# Patient Record
Sex: Male | Born: 1960 | Race: White | Hispanic: No | Marital: Single | Smoking: Never smoker
Health system: Southern US, Community
[De-identification: ages and names within clinical notes are randomized; demographics above are authoritative.]

## PROBLEM LIST (undated history)

## (undated) DIAGNOSIS — I2699 Other pulmonary embolism without acute cor pulmonale: Secondary | ICD-10-CM

## (undated) DIAGNOSIS — I82439 Acute embolism and thrombosis of unspecified popliteal vein: Secondary | ICD-10-CM

## (undated) HISTORY — DX: Other pulmonary embolism without acute cor pulmonale: I26.99

## (undated) HISTORY — DX: Acute embolism and thrombosis of unspecified popliteal vein: I82.439

---

## 2006-02-07 ENCOUNTER — Encounter: Admission: RE | Admit: 2006-02-07 | Discharge: 2006-02-07 | Payer: Self-pay | Admitting: Orthopedic Surgery

## 2008-04-08 ENCOUNTER — Encounter (INDEPENDENT_AMBULATORY_CARE_PROVIDER_SITE_OTHER): Payer: Self-pay | Admitting: Surgery

## 2008-04-08 ENCOUNTER — Ambulatory Visit (HOSPITAL_BASED_OUTPATIENT_CLINIC_OR_DEPARTMENT_OTHER): Admission: RE | Admit: 2008-04-08 | Discharge: 2008-04-08 | Payer: Self-pay | Admitting: Surgery

## 2009-10-17 ENCOUNTER — Emergency Department (HOSPITAL_BASED_OUTPATIENT_CLINIC_OR_DEPARTMENT_OTHER): Admission: EM | Admit: 2009-10-17 | Discharge: 2009-10-17 | Payer: Self-pay | Admitting: Emergency Medicine

## 2010-03-13 ENCOUNTER — Encounter: Payer: Self-pay | Admitting: Orthopedic Surgery

## 2010-06-07 LAB — DIFFERENTIAL
Basophils Relative: 1 % (ref 0–1)
Eosinophils Absolute: 0.2 10*3/uL (ref 0.0–0.7)
Lymphocytes Relative: 17 % (ref 12–46)
Monocytes Relative: 6 % (ref 3–12)

## 2010-06-07 LAB — CBC
HCT: 41.3 % (ref 39.0–52.0)
Hemoglobin: 14.2 g/dL (ref 13.0–17.0)
Platelets: 122 10*3/uL — ABNORMAL LOW (ref 150–400)
RBC: 4.72 MIL/uL (ref 4.22–5.81)
RDW: 13.6 % (ref 11.5–15.5)
WBC: 5.8 10*3/uL (ref 4.0–10.5)

## 2010-06-07 LAB — BASIC METABOLIC PANEL
Calcium: 8.8 mg/dL (ref 8.4–10.5)
Creatinine, Ser: 1.04 mg/dL (ref 0.4–1.5)
GFR calc non Af Amer: >60 mL/min (ref >60)
Glucose, Bld: 89 mg/dL (ref 70–99)
Potassium: 4.4 mEq/L (ref 3.5–5.1)
Sodium: 139 mEq/L (ref 135–145)

## 2010-06-07 LAB — POCT HEMOGLOBIN-HEMACUE: Hemoglobin: 14.7 g/dL (ref 13.0–17.0)

## 2010-07-05 NOTE — Op Note (Signed)
NAME:  Frank Barrett, Frank Barrett                ACCOUNT NO.:  [id number]   MEDICAL RECORD NO.:  [id number]          PATIENT TYPE:  AMB   LOCATION:  DSC                          FACILITY:  [facility]   PHYSICIAN:  Wilmon Arms. Corliss Skains, M.D. DATE OF BIRTH:  06-13-1960   DATE OF PROCEDURE:  04/08/2008  DATE OF DISCHARGE:                               OPERATIVE REPORT   PREOPERATIVE DIAGNOSIS:  Lipoma right flank 4 cm.   POSTOPERATIVE DIAGNOSIS:  Lipoma right flank 4 cm.   PROCEDURE PERFORMED:  Excision of lipoma 4 cm right flank subcutaneous  in location.   SURGEON:  Wilmon Arms. Tsuei, MD   ANESTHESIA:  Local MAC.   INDICATIONS:  The patient is a healthy 50 year old male who presented  with a recently noticed mass on his right flank.  It is occasionally  tender when he lies on this area.  It is fairly well demarcated.  I told  him that this likely represented a lipoma but due to the location and  tenderness,  he presents now for elective excision.   DESCRIPTION OF PROCEDURE:  The patient was brought to the operating room  and placed in a lateral position on the operating room table with his  right side up.  The area over the mass was prepped with Betadine and  draped in sterile fashion.  A time-out was taken to ensure the proper  patient and proper procedure.  We infiltrated the area around the mass  with 0.25% Marcaine with epinephrine.  A transverse incision was made  over this area.  Dissection was carried down to the surface of the  lipoma.  We bluntly dissected around the surface of the lipoma.  The  lipoma seemed to be densely adherent to the edge of the latissimus  muscle.  We were able to dissect in the tissue plane behind lipoma but  anterior to the muscle.  The lipoma was removed of its entirety and sent  for pathologic examination.  Hemostasis was good.  The wound was closed  with a deep layer of 3-0 Vicryl and a subcuticular layer of 4-0  Monocryl.  Steri-Strips and clean dressings were  applied.  The patient  was extubated and brought to recovery room in stable condition.  All  sponge, instrument, and needle counts were correct.      Wilmon Arms. Tsuei, M.D.  Electronically Signed     MKT/MEDQ  D:  04/08/2008  T:  04/08/2008  Job:  [id number]

## 2013-08-20 ENCOUNTER — Telehealth: Payer: Self-pay | Admitting: Hematology & Oncology

## 2013-08-20 NOTE — Telephone Encounter (Signed)
Spoke w NEW PATIENT wife Helene Kelp) today to remind them of their appointment with Dr. Marin Olp. Also, advised them to bring all medication bottles and insurance card information.

## 2013-08-21 ENCOUNTER — Other Ambulatory Visit: Payer: 59 | Admitting: Lab

## 2013-08-21 ENCOUNTER — Ambulatory Visit: Payer: 59

## 2013-08-21 ENCOUNTER — Ambulatory Visit (HOSPITAL_BASED_OUTPATIENT_CLINIC_OR_DEPARTMENT_OTHER): Payer: 59 | Admitting: Hematology & Oncology

## 2013-08-21 ENCOUNTER — Encounter: Payer: Self-pay | Admitting: Hematology & Oncology

## 2013-08-21 VITALS — BP 113/62 | HR 59 | Temp 97.6°F | Resp 18 | Ht 74.0 in | Wt 206.0 lb

## 2013-08-21 DIAGNOSIS — I824Y9 Acute embolism and thrombosis of unspecified deep veins of unspecified proximal lower extremity: Secondary | ICD-10-CM

## 2013-08-21 DIAGNOSIS — I2699 Other pulmonary embolism without acute cor pulmonale: Secondary | ICD-10-CM

## 2013-08-21 DIAGNOSIS — I82431 Acute embolism and thrombosis of right popliteal vein: Secondary | ICD-10-CM

## 2013-08-21 DIAGNOSIS — E291 Testicular hypofunction: Secondary | ICD-10-CM

## 2013-08-21 NOTE — Progress Notes (Signed)
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 NEW PATIENT  : I have blood clots in my lung.  HPI: Frank Barrett is a very nice 53 year old gentleman. He's been in very good health. He has a less gaping business. He also works for an Occupational [employer].  He plays basketball. Over the last 9 months, he's been on testosterone replacement therapy.  In early May, he began to have some shortness of breath. He saw his family doctor. I think he may be put on some antibiotic.  He did not improve. He is subsequently went to [facility]. He underwent a CT angiogram. I think he had an elevated d-dimer. The CT angiogram showed bilateral pulmonary emboli and. There was no right heart strain.  He had a Doppler of his legs. He was found to have a thrombus in the right popliteal vein. Thereafter he was admitted. He was placed on Lovenox. He then was placed on Xarelto.  He had hypercoagulable studies done. From what I can tell, his studies all were normal. The factor V Leiden and prothrombin II gene mutation were both negative. 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  He hasn't stopped the testosterone replacement gel.  He was currently referred to the [facility] for an evaluation.  He's had no fever. He's had no change in bowel bladder habits. His last colonoscopy was a couple years ago.  He's had no leg swelling. He denied any pain in the right leg.  There is no hemoptysis. He has lost a little weight.  He is taking some natural supplements for his prostate. I told him to stop these.  There is no headache. He's had no visual issues.   No past medical history on file.:  No past surgical history on file.:  Current outpatient prescriptions:cyclobenzaprine (FLEXERIL) 5 MG tablet, Take 5 mg by mouth at bedtime., Disp: , Rfl: ;  NON FORMULARY, Take by mouth every morning.  ginsana, Disp: , Rfl: ;  NON FORMULARY, Take by mouth every morning. Prostate complex, Disp: , Rfl: ;  NON FORMULARY, Take by mouth every morning. spectravite, Disp: , Rfl: ;  Omega-3 Fatty Acids (OMEGA 3 PO), Take by mouth every morning., Disp: , Rfl:  Omeprazole Magnesium 20.6 (20 BASE) MG CPDR, Take by mouth every morning., Disp: , Rfl: ;  rivaroxaban (XARELTO) 20 MG TABS tablet, Take 20 mg by mouth daily with supper., Disp: , Rfl: ;  Saw Palmetto 160 MG CAPS, Take by mouth every morning., Disp: , Rfl: :  :  No Known Allergies:  No family history on file.:  History   Social History  . Marital Status: Single    Spouse Name: N/A    Number of Children: N/A  . Years of Education: N/A   Occupational History  . Not on file.   Social History Main Topics  . Smoking status: Never Smoker   . Smokeless tobacco: Never Used     Comment: never used tobacco  . Alcohol Use: Not on file  . Drug Use: Not on file  . Sexual Activity: Not on file   Other Topics Concern  . Not on file   Social History Narrative  . No narrative on file  :  Pertinent items are noted in HPI.  Exam: @IPVITALS @  well-developed and well-nourished white children in no obvious distress. Vital signs show temperature of 97.6. Pulse 59. Blood pressure  113/62. Weight is 205 pounds. Head and neck exam shows some normocephalic atraumatic skull. There is no ocular or oral lesions. There is no adenopathy in the neck. Lungs are clear. No wheezes are noted. Axillary exam shows some enlarged left axillar lymph nodes. These measure about 2 cm. They're mobile and non-tender. No right axillary lymph nodes are noted. Cardiac exam regular in rhythm with a normal S1 and S2. There are no murmurs rubs or bruits. Abdomen is soft. Has good bowel sounds. There is no fluid wave. There is no palpable liver or spleen tip. Exam no tenderness over the spine ribs or hips. Extremities shows no clubbing cyanosis or edema. No venous cord is noted in  the legs. He has a negative Homans sign. Exam no rashes. Neurological exam is nonfocal.   No results found for this basename: WBC, HGB, HCT, PLT,  in the last 72 hours No results found for this basename: NA, K, CL, CO2, GLUCOSE, BUN, CREATININE, CALCIUM,  in the last 72 hours  Blood smear review: No data  Pathology: No data     Assessment and Plan: 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 The only risk factor that I could think of is the testosterone replacement therapy. This is a gel.  I believe that he needs to be on anticoagulation for a year. I did with bilateral pulmonary emboli, and a thrombus in the leg, and aggressive anticoagulation is indicated.  Xarelto is a good choice for him. This it would be I think very safe. Again I told him to stop some of these natural supplements as I am not sure how they would affect Xarelto.  I the one question is whether or not he can go back onto testosterone replacement therapy. I think this probably would be difficult to agree with. Again I don't see any other risk factor that he may have. He does not smoke. He does not have diabetes. He does not travel long distances. There is no family history.  I would like to get a repeat CT angiogram of his chest. I want to make sure that the emboli are resolving, if not resolved and also look at the axilla.  I also want to see about the thrombus in his right leg.  We will go ahead and plan to get him back in one month. We will get the scans next week.  I spent a good hour with he and his wife. I answered all their questions. I explained to him why I thought he needed one year of anticoagulation.

## 2013-09-01 ENCOUNTER — Ambulatory Visit (HOSPITAL_BASED_OUTPATIENT_CLINIC_OR_DEPARTMENT_OTHER): Payer: 59

## 2013-09-01 ENCOUNTER — Encounter (HOSPITAL_BASED_OUTPATIENT_CLINIC_OR_DEPARTMENT_OTHER): Payer: Self-pay

## 2013-09-01 ENCOUNTER — Other Ambulatory Visit (HOSPITAL_BASED_OUTPATIENT_CLINIC_OR_DEPARTMENT_OTHER): Payer: 59

## 2013-09-01 ENCOUNTER — Ambulatory Visit (HOSPITAL_BASED_OUTPATIENT_CLINIC_OR_DEPARTMENT_OTHER)
Admission: RE | Admit: 2013-09-01 | Discharge: 2013-09-01 | Disposition: A | Discharge status: Home / Self Care | Payer: 59 | Source: Ambulatory Visit | Attending: Hematology & Oncology | Admitting: Hematology & Oncology

## 2013-09-01 ENCOUNTER — Other Ambulatory Visit: Payer: Self-pay | Admitting: Hematology & Oncology

## 2013-09-01 DIAGNOSIS — I82431 Acute embolism and thrombosis of right popliteal vein: Secondary | ICD-10-CM

## 2013-09-01 DIAGNOSIS — I824Y9 Acute embolism and thrombosis of unspecified deep veins of unspecified proximal lower extremity: Secondary | ICD-10-CM | POA: Insufficient documentation

## 2013-09-01 DIAGNOSIS — I2699 Other pulmonary embolism without acute cor pulmonale: Secondary | ICD-10-CM

## 2013-09-01 DIAGNOSIS — Z86718 Personal history of other venous thrombosis and embolism: Secondary | ICD-10-CM | POA: Insufficient documentation

## 2013-09-01 MED ORDER — IOHEXOL 350 MG/ML SOLN
100.0000 mL | Freq: Once | INTRAVENOUS | Status: AC | PRN
  Administered 2013-09-01: 100 mL via INTRAVENOUS

## 2013-09-03 ENCOUNTER — Telehealth: Payer: Self-pay | Admitting: *Deleted

## 2013-09-03 NOTE — Telephone Encounter (Addendum)
Message copied by Lenn Sink on Wed Sep 03, 2013  1:19 PM ------      Message from: Burney Gauze R      Created: Wed Sep 03, 2013  7:24 AM       Call - NO blood clot in lungs.  Frank Barrett ------Informed pt that there is no blood clot in his lungs!

## 2013-09-03 NOTE — Telephone Encounter (Addendum)
Message copied by Lenn Sink on Wed Sep 03, 2013  1:33 PM ------      Message from: Burney Gauze R      Created: Wed Sep 03, 2013  7:10 AM       Call - no blood clot!!  Does have a cyst behind his right knee!!  If this is a problem, ortho surgery can deal with this.  pete ------Informed pt that no clot was found, however there is a cyst behind his right knee. Pt states the cyst has never bothered him. I informed pt that we could refer him to an ortho surgeon if it starts bothering him.

## 2013-09-24 ENCOUNTER — Ambulatory Visit (HOSPITAL_BASED_OUTPATIENT_CLINIC_OR_DEPARTMENT_OTHER): Payer: 59 | Admitting: Hematology & Oncology

## 2013-09-24 ENCOUNTER — Other Ambulatory Visit (HOSPITAL_BASED_OUTPATIENT_CLINIC_OR_DEPARTMENT_OTHER): Payer: 59 | Admitting: Lab

## 2013-09-24 ENCOUNTER — Encounter: Payer: Self-pay | Admitting: Hematology & Oncology

## 2013-09-24 VITALS — BP 113/64 | HR 63 | Temp 97.8°F | Resp 18 | Ht 71.0 in | Wt 204.0 lb

## 2013-09-24 DIAGNOSIS — I2699 Other pulmonary embolism without acute cor pulmonale: Secondary | ICD-10-CM

## 2013-09-24 DIAGNOSIS — I82431 Acute embolism and thrombosis of right popliteal vein: Secondary | ICD-10-CM

## 2013-09-24 DIAGNOSIS — Z86711 Personal history of pulmonary embolism: Secondary | ICD-10-CM

## 2013-09-24 DIAGNOSIS — R972 Elevated prostate specific antigen [PSA]: Secondary | ICD-10-CM

## 2013-09-24 DIAGNOSIS — I82439 Acute embolism and thrombosis of unspecified popliteal vein: Secondary | ICD-10-CM

## 2013-09-24 DIAGNOSIS — Z86718 Personal history of other venous thrombosis and embolism: Secondary | ICD-10-CM

## 2013-09-24 HISTORY — DX: Other pulmonary embolism without acute cor pulmonale: I26.99

## 2013-09-24 HISTORY — DX: Acute embolism and thrombosis of unspecified popliteal vein: I82.439

## 2013-09-24 LAB — CMP (CANCER CENTER ONLY)
ALK PHOS: 59 U/L (ref 26–84)
ALT: 26 U/L (ref 10–47)
AST: 23 U/L (ref 11–38)
Albumin: 3.4 g/dL (ref 3.3–5.5)
BUN, Bld: 14 mg/dL (ref 7–22)
CALCIUM: 8.4 mg/dL (ref 8.0–10.3)
CO2: 29 mEq/L (ref 18–33)
Chloride: 103 mEq/L (ref 98–108)
Creat: 1.3 mg/dl — ABNORMAL HIGH (ref 0.6–1.2)
GLUCOSE: 76 mg/dL (ref 73–118)
POTASSIUM: 3.9 meq/L (ref 3.3–4.7)
Sodium: 143 mEq/L (ref 128–145)
TOTAL PROTEIN: 6 g/dL — AB (ref 6.4–8.1)
Total Bilirubin: 0.5 mg/dl (ref 0.20–1.60)

## 2013-09-24 LAB — CBC WITH DIFFERENTIAL (CANCER CENTER ONLY)
BASO#: 0.1 10*3/uL (ref 0.0–0.2)
BASO%: 1 % (ref 0.0–2.0)
EOS%: 4.9 % (ref 0.0–7.0)
Eosinophils Absolute: 0.3 10*3/uL (ref 0.0–0.5)
HCT: 38.7 % (ref 38.7–49.9)
HEMOGLOBIN: 13.4 g/dL (ref 13.0–17.1)
LYMPH#: 0.6 10*3/uL — ABNORMAL LOW (ref 0.9–3.3)
LYMPH%: 8.3 % — AB (ref 14.0–48.0)
MCH: 29.9 pg (ref 28.0–33.4)
MCHC: 34.6 g/dL (ref 32.0–35.9)
MCV: 86 fL (ref 82–98)
MONO#: 0.5 10*3/uL (ref 0.1–0.9)
MONO%: 7.2 % (ref 0.0–13.0)
NEUT#: 5.5 10*3/uL (ref 1.5–6.5)
NEUT%: 78.6 % (ref 40.0–80.0)
Platelets: 209 10*3/uL (ref 145–400)
RBC: 4.48 10*6/uL (ref 4.20–5.70)
RDW: 13.9 % (ref 11.1–15.7)
WBC: 7 10*3/uL (ref 4.0–10.0)

## 2013-09-24 LAB — LACTATE DEHYDROGENASE: LDH: 167 U/L (ref 94–250)

## 2013-09-24 NOTE — Progress Notes (Signed)
Hematology and Oncology Follow Up Visit  Frank Barrett [id number] [birth year] 53 y.o. 09/24/2013   Principle Diagnosis:   Bilateral pulmonary emboli  Right popliteal vein DVT  Current Therapy:    Xarelto-one-year to be completed in May 2016     Interim History:  Mr.  Frank Barrett is back for second office visit. He saw him initially in early July. He is doing pretty well. He did a repeat CT angiogram done. This was negative for any pulmonary emboli. She also had a repeat Doppler of the right leg. This is negative for any residual thrombus.  His initial hypercoagulable studies were all normal.  She's not had any shortness of breath. Has had some fatigue. He is off testosterone now.  He has not had any bleeding. He has had no problems with bowels or bladder.  There is some concern about an elevated PSA. Now that he is off testosterone, we will followup with this. I told him he could have a biopsy if needed.  He is eating well. He's had no nausea or vomiting.  Medications: Current outpatient prescriptions:NON FORMULARY, Take by mouth Frank Barrett morning. spectravite, Disp: , Rfl: ;  Omega-3 Fatty Acids (OMEGA 3 PO), Take by mouth Frank Barrett morning., Disp: , Rfl: ;  Omeprazole Magnesium 20.6 (20 BASE) MG CPDR, Take by mouth as needed. , Disp: , Rfl: ;  rivaroxaban (XARELTO) 20 MG TABS tablet, Take 20 mg by mouth daily with supper., Disp: , Rfl:   Allergies: No Known Allergies  Past Medical History, Surgical history, Social history, and Family History were reviewed and updated.  Review of Systems: As above  Physical Exam:  height is 5\' 11"  (1.803 m) and weight is 204 lb (92.534 kg). His oral temperature is 97.8 F (36.6 C). His blood pressure is 113/64 and his pulse is 63. His respiration is 18.   Lungs are clear. Cardiac exam regular in rhythm. Abdomen soft. She has no fluid wave. There is no palpable liver or spleen tip. Back exam no tenderness over the spine. Extremities no clubbing cyanosis or  edema. No venous cord is noted in his legs. Skin exam no rashes, ecchymosis or petechia. Neurological exam is nonfocal.  Lab Results  Component Value Date   WBC 7.0 09/24/2013   HGB 13.4 09/24/2013   HCT 38.7 09/24/2013   MCV 86 09/24/2013   PLT 209 09/24/2013     Chemistry      Component Value Date/Time   NA 143 09/24/2013 1501   NA 139 04/06/2008 1600   K 3.9 09/24/2013 1501   K 4.4 04/06/2008 1600   CL 103 09/24/2013 1501   CL 106 04/06/2008 1600   CO2 29 09/24/2013 1501   CO2 27 04/06/2008 1600   BUN 14 09/24/2013 1501   BUN 15 04/06/2008 1600   CREATININE 1.3* 09/24/2013 1501   CREATININE 1.04 04/06/2008 1600      Component Value Date/Time   CALCIUM 8.4 09/24/2013 1501   CALCIUM 8.8 04/06/2008 1600   ALKPHOS 59 09/24/2013 1501   AST 23 09/24/2013 1501   ALT 26 09/24/2013 1501   BILITOT 0.50 09/24/2013 1501         Impression and Plan: [name] is 53 year old gentleman with an idiopathic pulmonary embolism and DVT of the right leg. The only possible risk factor could be the testosterone that he was on. He is off this now. He was wondering if he could take Viagra. I told him I do not see any problems with this.  I would told him that he could not take it with nitrates.  We will keep him on anticoagulation for one year. I think this would be appropriate.  We will see what his PSA is. Again, if he needs a biopsy he can have one.  I want to see her back in 4 months.   Volanda Napoleon, MD 8/5/20156:52 PM

## 2013-09-25 ENCOUNTER — Telehealth: Payer: Self-pay | Admitting: Hematology & Oncology

## 2013-09-25 NOTE — Telephone Encounter (Signed)
Mailed dec appointment

## 2013-09-26 LAB — LUPUS ANTICOAGULANT PANEL
DRVVT 1:1 Mix: 42.7 secs (ref <42.9)
DRVVT: 56.7 secs — ABNORMAL HIGH (ref <42.9)
Lupus Anticoagulant: NOT DETECTED
PTT LA: 39.6 s (ref 28.0–43.0)

## 2013-09-26 LAB — CARDIOLIPIN ANTIBODIES, IGG, IGM, IGA
ANTICARDIOLIPIN IGA: 0 U/mL (ref <22)
Anticardiolipin IgG: 0 GPL U/mL (ref <23)
Anticardiolipin IgM: 0 MPL U/mL (ref <11)

## 2013-09-26 LAB — PSA: PSA: 0.92 ng/mL (ref <=4.00)

## 2013-09-30 ENCOUNTER — Telehealth: Payer: Self-pay | Admitting: *Deleted

## 2013-09-30 NOTE — Telephone Encounter (Addendum)
Message copied by Lenn Sink on Tue Sep 30, 2013 11:18 AM ------      Message from: Volanda Napoleon      Created: Fri Sep 26, 2013  6:28 PM       Call  - PSA is 0.92,  This is good.  i think that the testosterone that he was on caused the PSA to go up!! pete ------Informed pt that PSA is 0.92, This is good. i think that the testosterone that he was on caused the PSA to go up

## 2013-11-19 ENCOUNTER — Other Ambulatory Visit (HOSPITAL_COMMUNITY)
Admission: RE | Admit: 2013-11-19 | Discharge: 2013-11-19 | Disposition: A | Discharge status: Home / Self Care | Payer: 59 | Source: Ambulatory Visit | Attending: Otolaryngology | Admitting: Otolaryngology

## 2013-11-19 ENCOUNTER — Other Ambulatory Visit: Payer: Self-pay | Admitting: Otolaryngology

## 2013-11-19 DIAGNOSIS — K119 Disease of salivary gland, unspecified: Secondary | ICD-10-CM | POA: Diagnosis present

## 2013-12-15 ENCOUNTER — Other Ambulatory Visit: Payer: Self-pay | Admitting: Otolaryngology

## 2013-12-15 DIAGNOSIS — D37039 Neoplasm of uncertain behavior of the major salivary glands, unspecified: Secondary | ICD-10-CM

## 2013-12-24 ENCOUNTER — Inpatient Hospital Stay: Admission: RE | Admit: 2013-12-24 | Payer: 59 | Source: Ambulatory Visit

## 2013-12-24 ENCOUNTER — Ambulatory Visit
Admission: RE | Admit: 2013-12-24 | Discharge: 2013-12-24 | Disposition: A | Discharge status: Home / Self Care | Payer: 59 | Source: Ambulatory Visit | Attending: Otolaryngology | Admitting: Otolaryngology

## 2013-12-24 MED ORDER — IOHEXOL 300 MG/ML  SOLN
75.0000 mL | Freq: Once | INTRAMUSCULAR | Status: AC | PRN
  Administered 2013-12-24: 75 mL via INTRAVENOUS

## 2014-01-08 ENCOUNTER — Other Ambulatory Visit: Payer: Self-pay | Admitting: Otolaryngology

## 2014-01-28 ENCOUNTER — Other Ambulatory Visit: Payer: 59 | Admitting: Lab

## 2014-01-28 ENCOUNTER — Ambulatory Visit: Payer: 59 | Admitting: Hematology & Oncology

## 2016-03-02 IMAGING — CT CT NECK W/ CM
4 of 5 series · 16 of 33 positions shown, 18 images · IV contrast (75CC OMNI 300)
Comparison: None.

CLINICAL DATA: Left parotid mass. First noticed 6 months to 1 year
ago. Increasing in size.

BUN and creatinine were obtained on site at [HOSPITAL] at
[HOSPITAL].
Results:  BUN 15 mg/dL,  Creatinine 1.0 mg/dL.
EXAM:
CT NECK WITH CONTRAST
TECHNIQUE: Multidetector CT imaging of the neck was performed using the
standard protocol following the bolus administration of intravenous
contrast.
CONTRAST:  75mL OMNIPAQUE IOHEXOL 300 MG/ML  SOLN

[Series 2: axial neck · axial · 0.45mm/px · z∈[-18,+146]mm · 4 of 134 slices shown]
[im 23/134  bone]
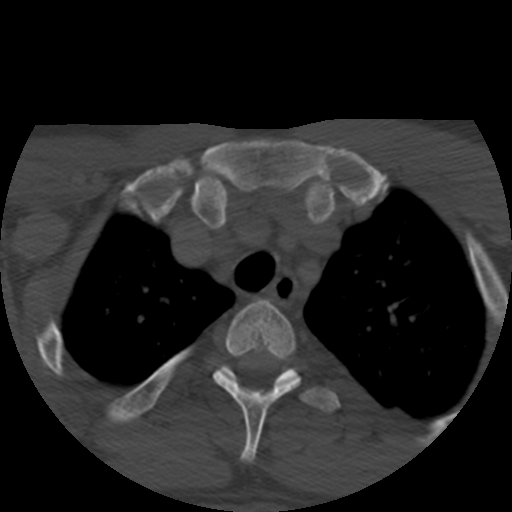
[im 45/134  bone]
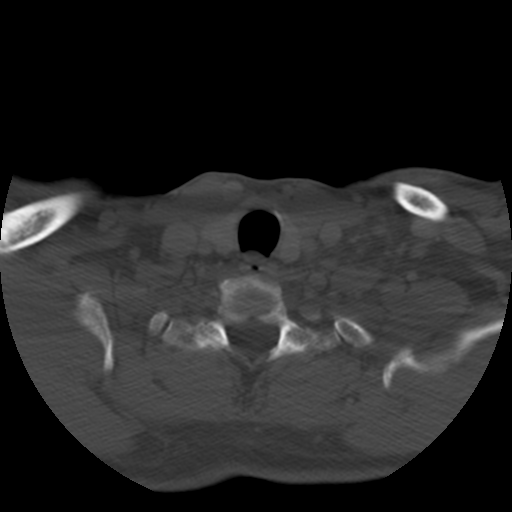
[im 67/134  bone]
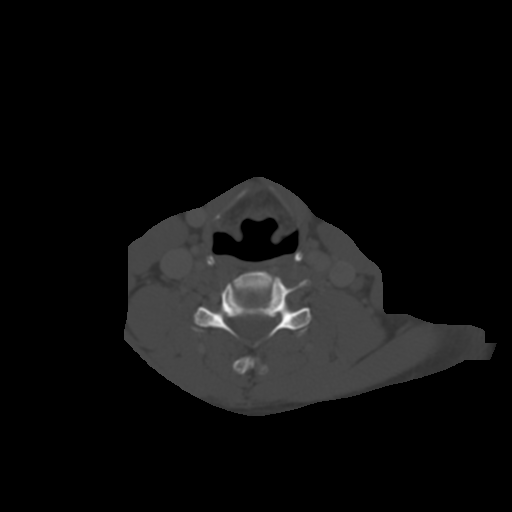
[im 89/134  bone]
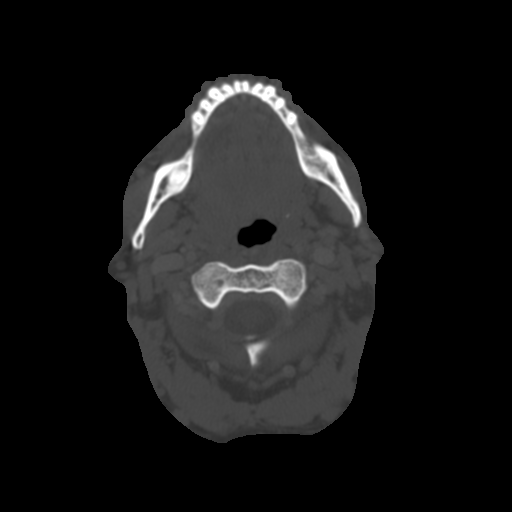

[Series 400: cor · coronal · 0.67mm/px · 3 of 97 slices shown]
[im 20/97  bone]
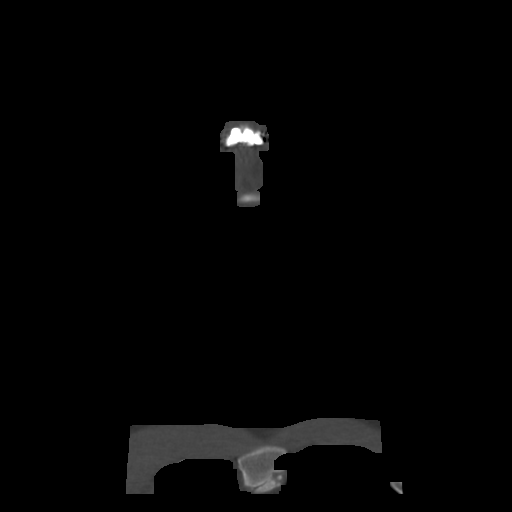
[im 39/97  bone]
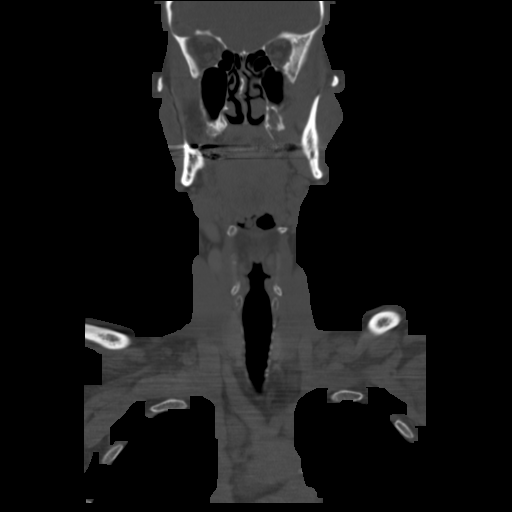
[im 58/97  bone]
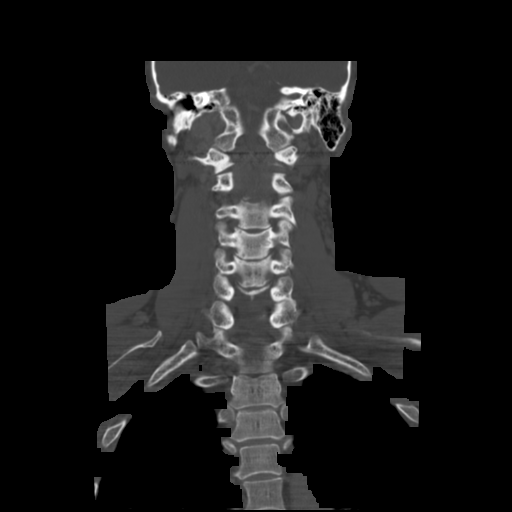

[Series 401: angled axials · axial · 0.67mm/px · z∈[-47,+93]mm · 4 of 130 slices shown, 5 images]
[im 26/130  soft-tissue]
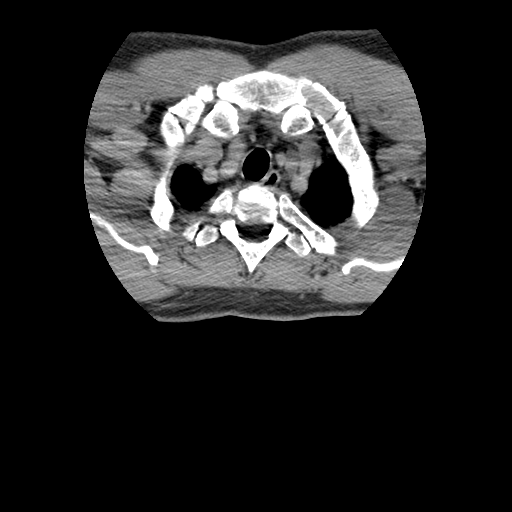
[im 26/130  bone]
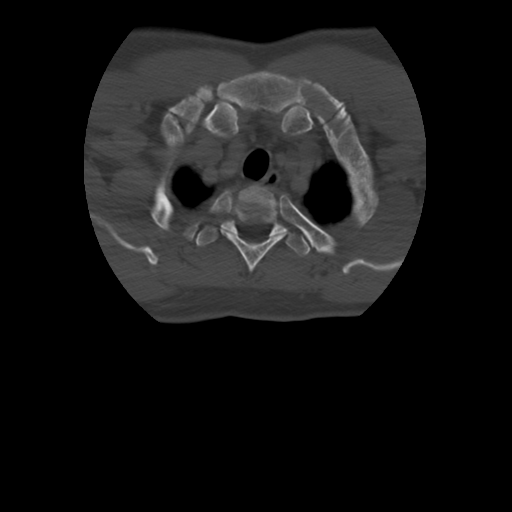
[im 52/130  bone]
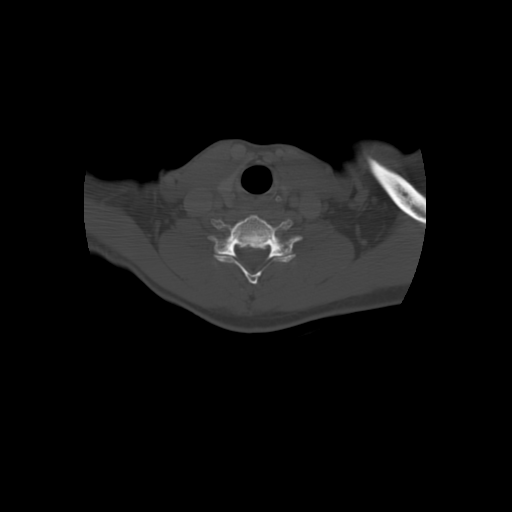
[im 78/130  bone]
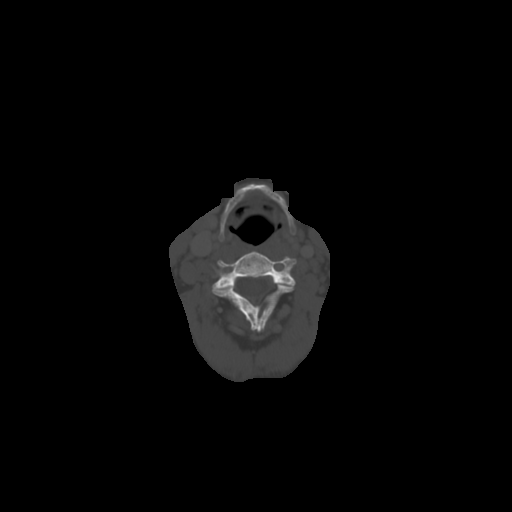
[im 104/130  bone]
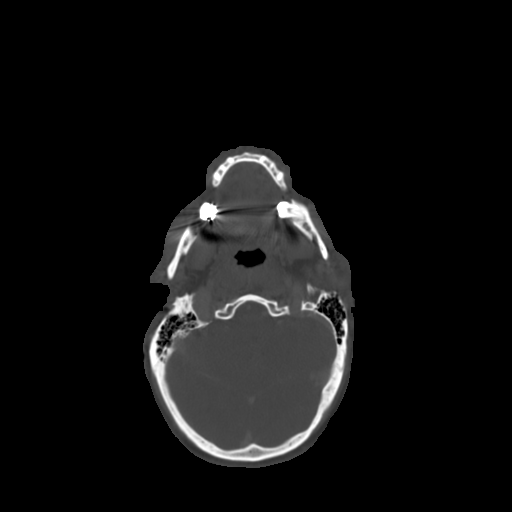

[Series 402: sag · sagittal · 0.67mm/px · 5 of 123 slices shown, 6 images]
[im 41/123  bone]
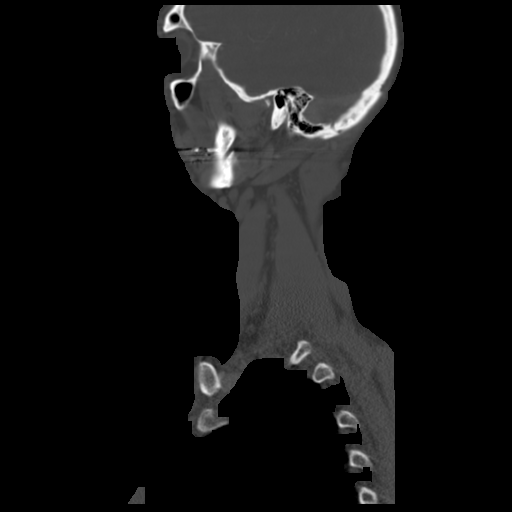
[im 51/123  bone]
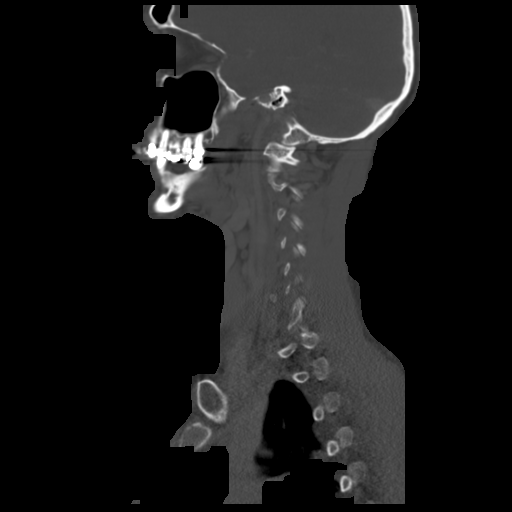
[im 62/123  soft-tissue]
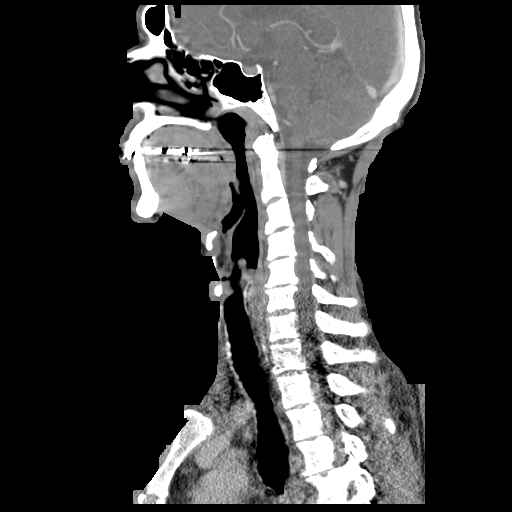
[im 62/123  bone]
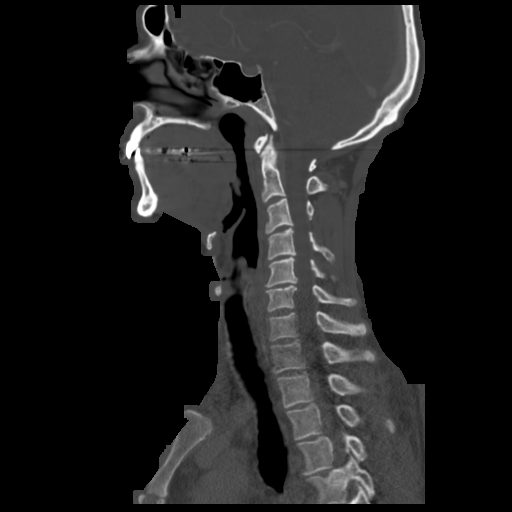
[im 72/123  bone]
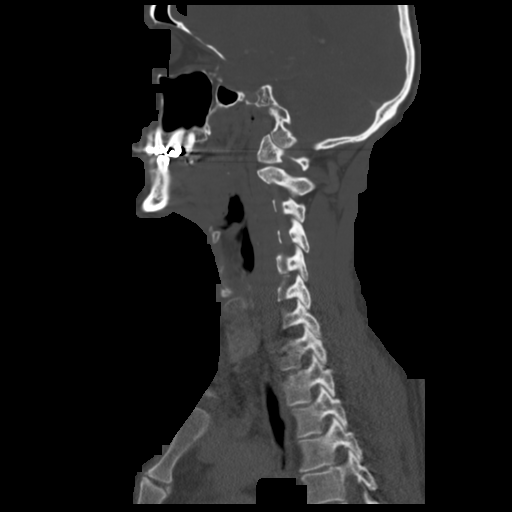
[im 82/123  bone]
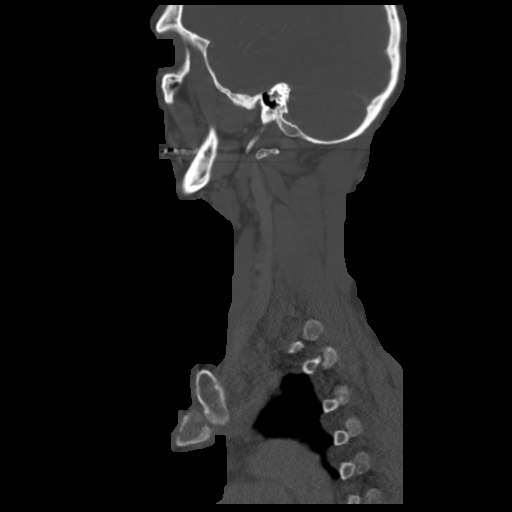

[16 of 33 positions shown; findings below may reference images not displayed]

FINDINGS: Lung apices are clear. There slightly prominent superior mediastinal
lymph nodes that have enlarged since a CT scan of 09/01/2013. Upper
right paratracheal lymph node shows a diameter of 15 mm, previously
measuring 7 mm.

No intracranial abnormality is seen.  Visualized sinuses are clear.

No focal lesion is discernible on the right parotid gland. In the
left parotid gland, in the far corner of the anterior process, there
is a 12 mm node or mass. Superficially, underlying the skin marker
marking the area of concern, there is a mass measuring 2.2 x 1.3 by
2.7 cm that could be a mass or more likely a parotid lymph node.
Submandibular glands are normal. Thyroid gland is normal.

There are slightly prominent level 1 lymph nodes bilaterally. There
are prominent level to de jugulodigastric nodes, probably pathologic
on the right at least with the largest measuring 11 x 17 by 23 mm.
Numerous other level 3, level 4 and level 5 lymph nodes are present.
They are abnormal particularly on the basis of multiplicity, with
many of the nodes approaching 1 cm in size. Supraclavicular match
head lymphadenopathy is present, more notable on the left than the
right, with the region measuring approximately 2.5 x 3 cm in
diameter.

Arterial and venous structures are patent.

There is soft tissue fullness in the region of the right tonsil,
measuring approximately 1.6 cm. This could represent a tonsillar
mass.

No significant bony finding.
IMPRESSION: Plethora of abnormal lymph nodes in the region of the left parotid
gland, throughout the neck, supraclavicular regions and superior
mediastinum. This could be seen with lymphoma or some sort of
reactive nodal pathology. This has developed since the CT scan of
09/01/2013. In particular, 2 lesions in the left parotid probably
represent parotid lymph nodes. Primary parotid mass is cannot be
excluded, but that seems less likely.

Fullness of soft tissues in the right tonsil region could represent
a 1.6 cm mass.

## 2020-01-21 DEATH — deceased
